# Patient Record
Sex: Male | Born: 1995 | Race: White | Hispanic: No | Marital: Single
Health system: Southern US, Community
[De-identification: ages and names within clinical notes are randomized; demographics above are authoritative.]

---
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
## 2017-03-12 DIAGNOSIS — F329 Major depressive disorder, single episode, unspecified: Secondary | ICD-10-CM | POA: Insufficient documentation

## 2017-03-12 DIAGNOSIS — F17200 Nicotine dependence, unspecified, uncomplicated: Secondary | ICD-10-CM | POA: Insufficient documentation

## 2017-03-12 DIAGNOSIS — F411 Generalized anxiety disorder: Secondary | ICD-10-CM | POA: Insufficient documentation

## 2019-12-30 ENCOUNTER — Ambulatory Visit: Payer: Self-pay | Attending: Internal Medicine

## 2019-12-30 DIAGNOSIS — Z23 Encounter for immunization: Secondary | ICD-10-CM

## 2019-12-30 NOTE — Progress Notes (Signed)
   Covid-19 Vaccination Clinic  Name:  Melvin Jensen    MRN: [id number] DOB: [birth year]  12/30/2019  [name] was observed post Covid-19 immunization for 15 minutes without incident. He was provided with Vaccine Information Sheet and instruction to access the V-Safe system.   [name] was instructed to call 911 with any severe reactions post vaccine: Marland Kitchen Difficulty breathing  . Swelling of face and throat  . A fast heartbeat  . A bad rash all over body  . Dizziness and weakness   Immunizations Administered    Name Date Dose VIS Date Route   Pfizer COVID-19 Vaccine 12/30/2019  2:38 PM 0.3 mL 09/08/2019 Intramuscular   Manufacturer: [employer], [employer]   Lot: [id number]   NDC: [address]-4

## 2020-01-24 ENCOUNTER — Ambulatory Visit: Payer: Self-pay | Attending: Internal Medicine

## 2020-01-24 DIAGNOSIS — Z23 Encounter for immunization: Secondary | ICD-10-CM

## 2020-01-24 NOTE — Progress Notes (Signed)
   Covid-19 Vaccination Clinic  Name:  TALI COSTER    MRN: [id number] DOB: May 09, 1996  01/24/2020  Mr. Mcconico was observed post Covid-19 immunization for 15 minutes without incident. He was provided with Vaccine Information Sheet and instruction to access the V-Safe system.   Mr. Fambro was instructed to call 911 with any severe reactions post vaccine: Marland Kitchen Difficulty breathing  . Swelling of face and throat  . A fast heartbeat  . A bad rash all over body  . Dizziness and weakness   Immunizations Administered    Name Date Dose VIS Date Route   Pfizer COVID-19 Vaccine 01/24/2020 12:44 PM 0.3 mL 11/22/2018 Intramuscular   Manufacturer: [employer], [employer]   Lot: [id number]   NDC: [address]-9

## 2021-06-04 ENCOUNTER — Other Ambulatory Visit
Admission: RE | Admit: 2021-06-04 | Discharge: 2021-06-04 | Disposition: A | Discharge status: Home / Self Care | Payer: BLUE CROSS/BLUE SHIELD | Source: Ambulatory Visit | Attending: Internal Medicine | Admitting: Internal Medicine

## 2021-06-04 DIAGNOSIS — R079 Chest pain, unspecified: Secondary | ICD-10-CM | POA: Insufficient documentation

## 2021-06-04 LAB — D-DIMER, QUANTITATIVE: D-Dimer, Quant: 0.71 ug/mL-FEU — ABNORMAL HIGH (ref 0.00–0.50)

## 2022-10-05 DIAGNOSIS — E782 Mixed hyperlipidemia: Secondary | ICD-10-CM | POA: Insufficient documentation

## 2023-06-17 ENCOUNTER — Ambulatory Visit: Payer: BLUE CROSS/BLUE SHIELD

## 2023-06-17 DIAGNOSIS — R195 Other fecal abnormalities: Secondary | ICD-10-CM | POA: Diagnosis not present

## 2023-06-17 DIAGNOSIS — K640 First degree hemorrhoids: Secondary | ICD-10-CM | POA: Diagnosis not present

## 2023-06-17 DIAGNOSIS — R1084 Generalized abdominal pain: Secondary | ICD-10-CM | POA: Diagnosis present

## 2023-06-17 DIAGNOSIS — K2970 Gastritis, unspecified, without bleeding: Secondary | ICD-10-CM | POA: Diagnosis not present

## 2023-06-17 DIAGNOSIS — R112 Nausea with vomiting, unspecified: Secondary | ICD-10-CM | POA: Diagnosis not present

## 2023-10-13 ENCOUNTER — Ambulatory Visit: Payer: Self-pay

## 2023-10-13 ENCOUNTER — Encounter: Payer: Self-pay | Admitting: Physician Assistant

## 2023-10-13 ENCOUNTER — Ambulatory Visit: Payer: Self-pay | Admitting: Physician Assistant

## 2023-10-13 VITALS — BP 130/90 | HR 70 | Temp 98.2°F | Resp 16 | Ht 71.0 in | Wt 150.0 lb

## 2023-10-13 DIAGNOSIS — Z021 Encounter for pre-employment examination: Secondary | ICD-10-CM

## 2023-10-13 DIAGNOSIS — Z0289 Encounter for other administrative examinations: Secondary | ICD-10-CM

## 2023-10-13 LAB — POCT URINE DRUG SCREEN
Methylenedioxyamphetamine: NOT DETECTED
POC Amphetamine UR: NOT DETECTED
POC BENZODIAZEPINES UR: NOT DETECTED
POC Barbiturate UR: NOT DETECTED
POC Cocaine UR: NOT DETECTED
POC DRUG SCREEN OXIDANTS URINE: NORMAL
POC Ecstasy UR: NOT DETECTED
POC Marijuana UR: NOT DETECTED
POC Methadone UR: NOT DETECTED
POC Methamphetamine UR: NOT DETECTED
POC Opiate Ur: NOT DETECTED
POC Oxycodone UR: NOT DETECTED
POC PH URINE: NORMAL
POC PHENCYCLIDINE UR: NOT DETECTED
POC SPECIFIC GRAVITY URINE: NORMAL
POC TRICYCLICS UR: NOT DETECTED
URINE TEMPERATURE: 96 [degF] (ref 90.0–100.0)

## 2023-10-13 LAB — POCT URINALYSIS DIPSTICK
Bilirubin, UA: NEGATIVE
Blood, UA: NEGATIVE
Glucose, UA: NEGATIVE
Ketones, UA: NEGATIVE
Leukocytes, UA: NEGATIVE
Nitrite, UA: NEGATIVE
Protein, UA: NEGATIVE
Spec Grav, UA: <=1.005 — AB (ref 1.010–1.025)
Urobilinogen, UA: 0.2 U/dL
pH, UA: 6.5 (ref 5.0–8.0)

## 2023-10-13 NOTE — Progress Notes (Signed)
 [facility] ____________________________________________   None    (approximate)  I have reviewed the triage vital signs and the nursing notes.   HISTORY  Chief Complaint Employment Physical   HPI Melvin Jensen is a 28 y.o. male patient is present for preemployment firefighter exam.  Patient was no concerns or complaints.        No past medical history on file.  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 No pertinent surgical history.  Prior to Admission medications   Medication Sig Start Date End Date Taking? Authorizing Provider  buPROPion (WELLBUTRIN XL) 150 MG 24 hr tablet Take 150 mg by mouth daily. Patient not taking: Reported on 10/13/2023 11/09/22 11/09/23  [provider]  escitalopram (LEXAPRO) 20 MG tablet Take 20 mg by mouth daily.    [provider]  Ibuprofen 200 MG CAPS Take 2 capsules by mouth as needed (headache).    [provider]    Allergies Amoxicillin and Alprazolam  Family History  Problem Relation Age of Onset   Aneurysm Mother    Diabetes type II Paternal Grandfather     Social History Social History   Tobacco Use   Smoking status: Former    Current packs/day: 0.00    Types: Cigarettes    Quit date: 2017    Years since quitting: 8.0   Smokeless tobacco: Never  Vaping Use   Vaping status: Every Day  Substance Use Topics   Alcohol use: Yes   Drug use: Never    Review of Systems Constitutional: No fever/chills Eyes: No visual changes. ENT: No sore throat. Cardiovascular: Denies chest pain. Respiratory: Denies shortness of breath. Gastrointestinal: No abdominal pain.  No nausea, no vomiting.  No diarrhea.  No constipation. Genitourinary: Negative for dysuria. Musculoskeletal: Negative for back  pain. Skin: Negative for rash. Neurological: Negative for headaches, focal weakness or numbness. Psychiatric: Anxiety/depression Endocrine: Hyperlipidemia   ____________________________________________   PHYSICAL EXAM:  VITAL SIGNS: BP 130/90BP. 130/90. Data is abnormal. Taken on 10/13/23 9:58 AM  Cuff Size Normal  Pulse Rate 70  Temp 98.2 F (36.8 C)  Temp Source Temporal  Weight 150 lb (68 kg)  Height 5\' 11"  (1.803 m)  Resp 16  SpO2 98 %   BMI: 20.92 kg/m2  BSA: 1.85 m2   Constitutional: Alert and oriented. Well appearing and in no acute distress. Eyes: Conjunctivae are normal. PERRL. EOMI. Head: Atraumatic. Nose: No congestion/rhinnorhea. Mouth/Throat: Mucous membranes are moist.  Oropharynx non-erythematous. Neck: No stridor.  No cervical spine tenderness to palpation. Hematological/Lymphatic/Immunilogical: No cervical lymphadenopathy. Cardiovascular: Normal rate, regular rhythm. Grossly normal heart sounds.  Good peripheral circulation. Respiratory: Normal respiratory effort.  No retractions. Lungs CTAB. Gastrointestinal: Soft and nontender. No distention. No abdominal bruits. No CVA tenderness. Genitourinary: Deferred Musculoskeletal: No lower extremity tenderness nor edema.  No joint effusions. Neurologic:  Normal speech and language. No gross focal neurologic deficits are appreciated. No gait instability. Skin:  Skin is warm, dry and intact. No rash noted. Psychiatric: Mood and affect are normal. Speech and behavior are normal.  ____________________________________________   LABS Pending ____________________________________________  EKG  Sinus rhythm at 66 bpm ____________________________________________   ____________________________________________   INITIAL IMPRESSION / ASSESSMENT AND PLAN   As part of my medical decision making, I reviewed the following data within the electronic MEDICAL RECORD NUMBER  Notes from prior ED visits and [place] Controlled  Substance Database      No acute findings on physical exam and EKG.  Labs are pending.      ____________________________________________   FINAL CLINICAL IMPRESSION On exam    ED Discharge Orders     None        Note:  This document was prepared using Dragon voice recognition software and may include unintentional dictation errors.

## 2023-10-13 NOTE — Progress Notes (Signed)
Pt cleared pre-employment uds. 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 Melvin Jensen

## 2023-10-13 NOTE — Progress Notes (Signed)
 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 Pt cleared UDS. Pt didn't voice any concerns or issues at this time Melvin Jensen

## 2023-10-16 LAB — CMP12+LP+TP+TSH+6AC+CBC/D/PLT
ALT: 24 [IU]/L (ref 0–44)
AST: 20 [IU]/L (ref 0–40)
Albumin: 5.2 g/dL (ref 4.3–5.2)
Alkaline Phosphatase: 74 [IU]/L (ref 44–121)
BUN/Creatinine Ratio: 10 (ref 9–20)
BUN: 7 mg/dL (ref 6–20)
Basophils Absolute: 0 10*3/uL (ref 0.0–0.2)
Basos: 1 %
Bilirubin Total: 0.5 mg/dL (ref 0.0–1.2)
Calcium: 10 mg/dL (ref 8.7–10.2)
Chloride: 104 mmol/L (ref 96–106)
Chol/HDL Ratio: 5 {ratio} (ref 0.0–5.0)
Cholesterol, Total: 211 mg/dL — ABNORMAL HIGH (ref 100–199)
Creatinine, Ser: 0.67 mg/dL — ABNORMAL LOW (ref 0.76–1.27)
EOS (ABSOLUTE): 0.1 10*3/uL (ref 0.0–0.4)
Eos: 2 %
Estimated CHD Risk: 1 times avg. (ref 0.0–1.0)
Free Thyroxine Index: 1.7 (ref 1.2–4.9)
GGT: 31 [IU]/L (ref 0–65)
Globulin, Total: 2.9 g/dL (ref 1.5–4.5)
Glucose: 87 mg/dL (ref 70–99)
HDL: 42 mg/dL (ref >39)
Hematocrit: 40.9 % (ref 37.5–51.0)
Hemoglobin: 14.1 g/dL (ref 13.0–17.7)
Immature Grans (Abs): 0 10*3/uL (ref 0.0–0.1)
Immature Granulocytes: 1 %
Iron: 119 ug/dL (ref 38–169)
LDH: 160 [IU]/L (ref 121–224)
LDL Chol Calc (NIH): 142 mg/dL — ABNORMAL HIGH (ref 0–99)
Lymphocytes Absolute: 1.5 10*3/uL (ref 0.7–3.1)
Lymphs: 37 %
MCH: 29.9 pg (ref 26.6–33.0)
MCHC: 34.5 g/dL (ref 31.5–35.7)
MCV: 87 fL (ref 79–97)
Monocytes Absolute: 0.3 10*3/uL (ref 0.1–0.9)
Monocytes: 7 %
Neutrophils Absolute: 2.2 10*3/uL (ref 1.4–7.0)
Neutrophils: 52 %
Phosphorus: 3.1 mg/dL (ref 2.8–4.1)
Platelets: 274 10*3/uL (ref 150–450)
Potassium: 4.1 mmol/L (ref 3.5–5.2)
RBC: 4.72 x10E6/uL (ref 4.14–5.80)
RDW: 12.5 % (ref 11.6–15.4)
Sodium: 142 mmol/L (ref 134–144)
T3 Uptake Ratio: 26 % (ref 24–39)
T4, Total: 6.5 ug/dL (ref 4.5–12.0)
TSH: 1.71 u[IU]/mL (ref 0.450–4.500)
Total Protein: 8.1 g/dL (ref 6.0–8.5)
Triglycerides: 152 mg/dL — ABNORMAL HIGH (ref 0–149)
Uric Acid: 6.4 mg/dL (ref 3.8–8.4)
VLDL Cholesterol Cal: 27 mg/dL (ref 5–40)
WBC: 4.1 10*3/uL (ref 3.4–10.8)
eGFR: 130 mL/min/{1.73_m2} (ref >59)

## 2023-10-16 LAB — HEPATITIS B SURFACE ANTIBODY,QUALITATIVE: Hep B Surface Ab, Qual: UNDETERMINED

## 2023-10-16 LAB — QUANTIFERON-TB GOLD PLUS
QuantiFERON Mitogen Value: >10 [IU]/mL
QuantiFERON Nil Value: 0.01 [IU]/mL
QuantiFERON TB1 Ag Value: 0.02 [IU]/mL
QuantiFERON TB2 Ag Value: 0.01 [IU]/mL
QuantiFERON-TB Gold Plus: NEGATIVE

## 2024-11-01 ENCOUNTER — Encounter: Payer: Self-pay | Admitting: Physician Assistant

## 2024-11-01 ENCOUNTER — Ambulatory Visit: Payer: Self-pay | Admitting: Physician Assistant

## 2024-11-01 DIAGNOSIS — R0981 Nasal congestion: Secondary | ICD-10-CM

## 2024-11-01 DIAGNOSIS — R5383 Other fatigue: Secondary | ICD-10-CM

## 2024-11-01 DIAGNOSIS — R051 Acute cough: Secondary | ICD-10-CM

## 2024-11-01 LAB — POCT INFLUENZA A/B
Influenza A, POC: POSITIVE — AB
Influenza B, POC: NEGATIVE

## 2024-11-01 MED ORDER — OSELTAMIVIR PHOSPHATE 75 MG PO CAPS: 75.0000 mg | ORAL_CAPSULE | Freq: Two times a day (BID) | ORAL | 0 refills | Status: AC

## 2024-11-01 MED ORDER — PSEUDOEPH-BROMPHEN-DM 30-2-10 MG/5ML PO SYRP: 5.0000 mL | ORAL_SOLUTION | Freq: Four times a day (QID) | ORAL | 0 refills | Status: AC | PRN

## 2024-11-01 NOTE — Progress Notes (Signed)
 Reports 3 days of cough, congestion and increased lethargy with some sweats/chills unrelieved with OTC.  Tested positive in car for flu A and had televisit with provider.

## 2024-11-01 NOTE — Progress Notes (Signed)
" ° °  Subjective: Cough and congestion    Patient ID: Melvin Jensen, male    DOB: 1996/02/06, 29 y.o.   MRN: [id number]  HPI Patient reports 3 days of lethargy, cough, nasal and chest congestion.  States chills but unsure of fever.  States no relief with over-the-counter medications.  Patient unsure if he taken the flu shot for this season.   Review of Systems Anxiety/depression and hyperlipidemia.    Objective:   Physical Exam Patient tested positive for influenza A.  Physical exam was deferred secondary to this being a telephonic encounter.       Assessment & Plan: Influenza A   Patient given a prescription for Tamiflu  and Bromfed-DM.  Advised over-the-counter Tylenol/ibuprofen for fever and myalgia.  Follow-up if no improvement or worsening complaint. "
